# Patient Record
Sex: Female | Born: 1965 | Race: White | Hispanic: Yes | Marital: Married | Smoking: Never smoker
Health system: Southern US, Community
[De-identification: ages and names within clinical notes are randomized; demographics above are authoritative.]

---

## 1993-10-04 HISTORY — PX: CHOLECYSTECTOMY: SHX55

## 2009-01-29 ENCOUNTER — Ambulatory Visit: Payer: Self-pay | Admitting: Family Medicine

## 2009-01-29 DIAGNOSIS — O91219 Nonpurulent mastitis associated with pregnancy, unspecified trimester: Secondary | ICD-10-CM | POA: Insufficient documentation

## 2009-01-30 ENCOUNTER — Encounter: Payer: Self-pay | Admitting: Family Medicine

## 2009-02-27 ENCOUNTER — Encounter: Payer: Self-pay | Admitting: Family Medicine

## 2009-02-28 ENCOUNTER — Ambulatory Visit: Payer: Self-pay | Admitting: Physician Assistant

## 2009-03-19 ENCOUNTER — Ambulatory Visit: Payer: Self-pay | Admitting: Obstetrics & Gynecology

## 2009-03-19 ENCOUNTER — Encounter: Payer: Self-pay | Admitting: Obstetrics & Gynecology

## 2009-03-19 ENCOUNTER — Encounter: Admission: RE | Admit: 2009-03-19 | Discharge: 2009-03-19 | Payer: Self-pay | Admitting: Family Medicine

## 2009-03-19 ENCOUNTER — Ambulatory Visit: Payer: Self-pay | Admitting: Family Medicine

## 2009-03-19 DIAGNOSIS — M25569 Pain in unspecified knee: Secondary | ICD-10-CM

## 2009-03-19 DIAGNOSIS — R21 Rash and other nonspecific skin eruption: Secondary | ICD-10-CM

## 2009-03-20 ENCOUNTER — Encounter: Payer: Self-pay | Admitting: Family Medicine

## 2009-03-21 ENCOUNTER — Ambulatory Visit: Payer: Self-pay | Admitting: Family Medicine

## 2009-03-21 DIAGNOSIS — B353 Tinea pedis: Secondary | ICD-10-CM

## 2009-03-25 LAB — CONVERTED CEMR LAB
BUN: 13 mg/dL (ref 6–23)
Basophils Relative: 1 % (ref 0–1)
CO2: 23 meq/L (ref 19–32)
Calcium: 9 mg/dL (ref 8.4–10.5)
Chloride: 109 meq/L (ref 96–112)
Cholesterol: 149 mg/dL (ref 0–200)
Creatinine, Ser: 0.63 mg/dL (ref 0.40–1.20)
Eosinophils Absolute: 0.1 10*3/uL (ref 0.0–0.7)
HDL: 62 mg/dL (ref >39)
MCHC: 34.6 g/dL (ref 30.0–36.0)
MCV: 94.6 fL (ref 78.0–100.0)
Monocytes Absolute: 0.4 10*3/uL (ref 0.1–1.0)
Monocytes Relative: 6 % (ref 3–12)
Neutrophils Relative %: 56 % (ref 43–77)
RBC: 4.09 M/uL (ref 3.87–5.11)
Total CHOL/HDL Ratio: 2.4

## 2009-10-30 ENCOUNTER — Ambulatory Visit: Payer: Self-pay | Admitting: Family Medicine

## 2009-10-30 DIAGNOSIS — S0430XA Injury of trigeminal nerve, unspecified side, initial encounter: Secondary | ICD-10-CM

## 2010-03-16 ENCOUNTER — Ambulatory Visit: Payer: Self-pay | Admitting: Family Medicine

## 2010-03-16 DIAGNOSIS — M542 Cervicalgia: Secondary | ICD-10-CM | POA: Insufficient documentation

## 2010-03-16 DIAGNOSIS — R209 Unspecified disturbances of skin sensation: Secondary | ICD-10-CM | POA: Insufficient documentation
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
## 2010-11-03 NOTE — Assessment & Plan Note (Signed)
Summary: ARM PROBLEMS--SHOULDER   Vital Signs:  Patient profile:   45 year old female Height:      60 inches Weight:      131 pounds BMI:     25.68 O2 Sat:      99 % on Room air Pulse rate:   72 / minute BP sitting:   106 / 70  (left arm) Cuff size:   regular  Vitals Entered By: Payton Spark CMA (March 16, 2010 2:47 PM)  O2 Flow:  Room air CC: L neck and arm pain x 2 weeks. Also requests Rx for toenail fungus since she is no longer breast feeding.   Primary Care Provider:  Nani Gasser MD  CC:  L neck and arm pain x 2 weeks. Also requests Rx for toenail fungus since she is no longer breast feeding.Marland Kitchen  History of Present Illness: 45 yo woman here today for R neck and L arm pain.  L arm goes numb when pt is asleep.  reports sleeping on her back.  no current numbness but reports sxs are more frequent.  has been using ice on her neck w/out relief.  ibuprofen provides temporary relief.  some weakness of L grip when arm is numb.  currently no weakness- 'es normal'.  fungal infxn- pt requesting oral medication now that she is no longer breast feeding.  this was discussed at pt's last OV.  **almost impossible to obtain a hx from pt b/c she does not speak English and is having her oldest child translate for her.  has 3 other children in the room running around and screaming.  she is more preoccupied with them than with the appt and answering questions**  Problems Prior to Update: 1)  Arm Numbness  (ICD-782.0) 2)  Neck Pain, Right  (ICD-723.1) 3)  Injury To Trigeminal Nerve  (ICD-951.2) 4)  Tinea Pedis  (ICD-110.4) 5)  Skin Rash  (ICD-782.1) 6)  Knee Pain, Bilateral  (ICD-719.46) 7)  Health Maintenance Exam  (ICD-V70.0) 8)  Mastitis, Lactating  (ICD-675.20)  Current Medications (verified): 1)  Systane Eye Drops .... As Needed 2)  Errin 0.35 Mg Tabs (Norethindrone (Contraceptive)) .Marland Kitchen.. 1 By Mouth Daily  Allergies (verified): No Known Drug Allergies  Review of Systems  See HPI  Physical Exam  General:  Well-developed,well-nourished,in no acute distress; alert,appropriate and cooperative throughout examination Head:  Normocephalic and atraumatic without obvious abnormalities. No apparent alopecia or balding. Neck:  + Spurling's bilaterally + TTP along R SCM muscle, no pain w/ flexion and extension of neck but mild pain w/ rotation Msk:  full ROM of shoulders bilaterally Extremities:  No clubbing, cyanosis, edema, or deformity noted with normal full range of motion of all joints.   Neurologic:  cranial nerves II-XII intact, strength normal in all extremities, sensation intact to light touch, and DTRs symmetrical and normal.   Skin:  + tinea pedis   Impression & Recommendations:  Problem # 1:  NECK PAIN, RIGHT (ICD-723.1) Assessment New give pt's tenderness along her SCM is most likely muscular in nature.  start NSAID and muscle relaxer.  get Cspine films to r/o misalignment/subluxation.  ? arthritic component given + Spurling's, will need f/u. Her updated medication list for this problem includes:    Naprosyn 500 Mg Tabs (Naproxen) .Marland Kitchen... 1 two times a day x10 days and then as needed.  take w/ food.  please label in spanish.    Cyclobenzaprine Hcl 10 Mg Tabs (Cyclobenzaprine hcl) .Marland Kitchen... 1 by mouth at bedtime as  needed for muscle spasm.  please label in spanish  Orders: T-Cervical Spine Comp 4 Views [phone])  Problem # 2:  ARM NUMBNESS (ICD-782.0) Assessment: New no current signs, pt reports this only happens when she is asleep.  likely positional.  if sxs persist after course of NSAIDs and muscle relaxers may need additional w/u.  as noted, very difficult to get information re: this problem from pt due to chaotic environment in exam room.  Problem # 3:  TINEA PEDIS (ICD-110.4) Assessment: Unchanged as discussed at previous OV will start pt on oral meds.  LFTs from recent CPE were WNL. The following medications were removed from the medication list:     Econazole Nitrate 1 % Crea (Econazole nitrate) .Marland Kitchen... Apply once daily after wash feet for 3 weeks.  try to keep feet dry if possible. Her updated medication list for this problem includes:    Lamisil 250 Mg Tabs (Terbinafine hcl) .Marland Kitchen... 1 tab by mouth daily x12 weeks.  please label in spanish  Complete Medication List: 1)  Systane Eye Drops  .... As needed 2)  Errin 0.35 Mg Tabs (Norethindrone (contraceptive)) .Marland Kitchen.. 1 by mouth daily 3)  Naprosyn 500 Mg Tabs (Naproxen) .Marland Kitchen.. 1 two times a day x10 days and then as needed.  take w/ food.  please label in spanish. 4)  Cyclobenzaprine Hcl 10 Mg Tabs (Cyclobenzaprine hcl) .Marland Kitchen.. 1 by mouth at bedtime as needed for muscle spasm.  please label in spanish 5)  Lamisil 250 Mg Tabs (Terbinafine hcl) .Marland Kitchen.. 1 tab by mouth daily x12 weeks.  please label in spanish  Patient Instructions: 1)  Please follow up in 2 weeks if no improvement in pain or numbness 2)  Take the Naprosyn as directed for inflammation 3)  Use the Cyclobenzaprine nightly as needed for muscle spasm 4)  Use a heating pad to help relax the muscles 5)  Take the Lamisil daily for 12 weeks for the foot fungus 6)  If your symptoms worsen please go to the ER 7)  Hang in there! Prescriptions: LAMISIL 250 MG TABS (TERBINAFINE HCL) 1 tab by mouth daily x12 weeks.  please label in spanish  #84 x 0   Entered and Authorized by:   Neena Rhymes MD   Signed by:   Neena Rhymes MD on 03/16/2010   Method used:   Electronically to        [facility] (retail)       [address]       [place], [place]  [address]       Ph: [phone]       Fax: [phone]   RxID:   [id number] CYCLOBENZAPRINE HCL 10 MG  TABS (CYCLOBENZAPRINE HCL) 1 by mouth at bedtime as needed for muscle spasm.  please label in spanish  #20 x 0   Entered and Authorized by:   Neena Rhymes MD   Signed by:   Neena Rhymes MD on 03/16/2010   Method used:   Electronically to        [facility]  (retail)       [address]       [place], [place]  [address]       Ph: [phone]       Fax: [phone]   RxID:   [id number] NAPROSYN 500 MG TABS (NAPROXEN) 1 two times a day x10 days and then as needed.  take w/ food.  please label in spanish.  #60 x 0  Entered and Authorized by:   Neena Rhymes MD   Signed by:   Neena Rhymes MD on 03/16/2010   Method used:   Electronically to        [facility] (retail)       [address]       [place], [place]  [address]       Ph: [phone]       Fax: [phone]   RxID:   [id number]

## 2010-11-06 NOTE — Letter (Signed)
Summary: Medical Report Form/[facility]  Medical Report Form/[facility]   Imported By: Lanelle Bal 11/06/2009 10:14:32  _____________________________________________________________________  External Attachment:    Type:   Image     Comment:   External Document

## 2011-01-26 ENCOUNTER — Encounter: Payer: Self-pay | Admitting: Family Medicine

## 2011-01-26 ENCOUNTER — Inpatient Hospital Stay (INDEPENDENT_AMBULATORY_CARE_PROVIDER_SITE_OTHER)
Admission: RE | Admit: 2011-01-26 | Discharge: 2011-01-26 | Disposition: A | Discharge status: Home / Self Care | Payer: 59 | Source: Ambulatory Visit | Attending: Family Medicine | Admitting: Family Medicine

## 2011-01-26 DIAGNOSIS — M7710 Lateral epicondylitis, unspecified elbow: Secondary | ICD-10-CM

## 2011-01-26 DIAGNOSIS — J309 Allergic rhinitis, unspecified: Secondary | ICD-10-CM | POA: Insufficient documentation

## 2011-01-26 DIAGNOSIS — R519 Headache, unspecified: Secondary | ICD-10-CM | POA: Insufficient documentation

## 2011-01-26 DIAGNOSIS — D509 Iron deficiency anemia, unspecified: Secondary | ICD-10-CM | POA: Insufficient documentation

## 2011-01-26 DIAGNOSIS — M7520 Bicipital tendinitis, unspecified shoulder: Secondary | ICD-10-CM

## 2011-01-26 DIAGNOSIS — N920 Excessive and frequent menstruation with regular cycle: Secondary | ICD-10-CM | POA: Insufficient documentation

## 2011-01-26 DIAGNOSIS — R51 Headache: Secondary | ICD-10-CM | POA: Insufficient documentation

## 2011-02-13 ENCOUNTER — Other Ambulatory Visit: Payer: Self-pay | Admitting: Family Medicine

## 2011-02-15 NOTE — Telephone Encounter (Signed)
Pt was seen while covering for Dr Linford Arnold at [facility].  No meds from this office.

## 2011-02-15 NOTE — Telephone Encounter (Signed)
Noted, will FWD to appropriate MD.

## 2011-02-15 NOTE — Telephone Encounter (Signed)
Last refilled 03/16/10 and appears that pt is due for CPX. Please advise.

## 2011-02-16 NOTE — Assessment & Plan Note (Signed)
Lisa Krueger, Lisa Krueger              ACCOUNT NO.:  [id number]   MEDICAL RECORD NO.:  [id number]          PATIENT TYPE:  POB   LOCATION:  [facility] [place]         FACILITY:  [facility]   PHYSICIAN:  Allie Bossier, MD        DATE OF BIRTH:  [birth year]   DATE OF SERVICE:  03/18/2009                                  CLINIC NOTE   HISTORY:  Lisa Krueger is a 45 year old married Hispanic gravida 7, para  6, abortus 1, who now has a 69-month-old child that she is breast-  feeding.  She comes in here for an annual exam.  She wishes to have a  tubal ligation scheduled.   PAST MEDICAL HISTORY:  None.   PAST SURGICAL HISTORY:  Laparoscopic cholecystectomy.   REVIEW OF SYSTEMS:  She is married for 17 years.  She has been abstinent  for the last 4 months due to her fear of getting pregnant.  She is  breast-feeding.  Pap smear was done in 2009 and was normal.   MEDICATIONS:  1. Errin oral contraceptive pills.  2. Multivitamins daily.  3. She use hydrocortisone cream to a fungal infection on her toes.   ALLERGIES:  NO KNOWN DRUG ALLERGIES.  No latex allergies.   FAMILY HISTORY:  Negative for breast and colon cancer, but her mother  had uterine cancer.   PHYSICAL EXAMINATION:  VITAL SIGNS:  Weight 117, height 5 feet, blood  pressure 109/72, pulse 74.  HEENT:  Normal.  HEART:  Regular rate and rhythm.  LUNGS:  Clear to auscultation bilaterally.  BREASTS:  Normal.  ABDOMEN:  Benign.  EXTERNAL GENITALIA:  No lesions.  Cervix parous, no lesions.  Normal  discharge.  Uterus normal size and shape, retroverted, nontender.  Adnexa nontender.  No masses.   ASSESSMENT/PLAN:  1. Annual exam.  Check the Pap smear.  Recommend self-breast exams.      Please note that she was treated recently with doxycycline for      mastitis that has cleared up now.  2. Multiparity, desires sterility in the form of a tubal ligation.  I      will submit the paperwork and it will be scheduled at her       convenience.      Allie Bossier, MD     MCD/MEDQ  D:  03/19/2009  T:  03/19/2009  Job:  [id number]

## 2011-09-06 NOTE — Progress Notes (Signed)
Summary: dizzyness/left hand fills numb/bleeding for 2 weeks( room 2)   Vital Signs:  Patient Profile:   45 Years Old Female CC:      left arm pain from shoulder to hand; worsened past 2 days Height:     60 inches Weight:      136.50 pounds O2 treatment:    Room Air Temp:     98.2 degrees F oral Pulse rate:   73 / minute Resp:     16 per minute BP sitting:   107 / 73  (left arm) Cuff size:   regular  Pt. in pain?   yes    Location:    left shoulder/arm    Type:       heaviness  Vitals Entered By: Lavell Islam RN (January 26, 2011 10:09 AM)                   Current Allergies: No known allergies History of Present Illness Chief Complaint: left arm pain from shoulder to hand; worsened past 2 days History of Present Illness:  Subjective:  Patient complains of vague pain in her left arm and shoulder for about 2 days.  No chest pain.  No trauma to the arm. She also complains of persistent vaginal bleeding without abdominal pain.  She recently had a negative pregnancy test, and has an appt with gyn Dr. Ivor Costa on 02/02/11 at 2:20 PM for evaluation.  No nausea/vomiting.  No fever.  REVIEW OF SYSTEMS Constitutional Symptoms       Complains of fatigue.     Denies fever, chills, night sweats, weight loss, and weight gain.      Comments: not sleeping past 2 nights Eyes       Complains of glasses.      Denies change in vision, eye pain, eye discharge, contact lenses, and eye surgery.      Comments: itchy eyes/ allergies Ear/Nose/Throat/Mouth       Complains of frequent runny nose.      Denies hearing loss/aids, change in hearing, ear pain, ear discharge, dizziness, frequent nose bleeds, sinus problems, sore throat, hoarseness, and tooth pain or bleeding.  Respiratory       Denies dry cough, productive cough, wheezing, shortness of breath, asthma, bronchitis, and emphysema/COPD.  Cardiovascular       Denies murmurs, chest pain, and tires easily with exhertion.    Gastrointestinal       Denies stomach pain, nausea/vomiting, diarrhea, constipation, blood in bowel movements, and indigestion. Genitourniary       Complains of blood or discharge from vagina.      Denies painful urination, kidney stones, and loss of urinary control.      Comments: vaginal bleeding x 2 weeks Neurological       Complains of headaches, numbness, and weakness.      Denies loss of or changes in sensation, tngling, tremors, paralysis, seizures, and fainting/blackouts.      Comments: left arm Musculoskeletal       Complains of muscle pain, joint pain, and muscle weakness.      Denies joint stiffness, decreased range of motion, redness, swelling, and gout.      Comments: left arm; shoulder to hand Skin       Denies bruising, unusual mles/lumps or sores, and hair/skin or nail changes.  Psych       Denies mood changes, temper/anger issues, anxiety/stress, speech problems, depression, and sleep problems. Other Comments: primary concern left arm discomfort for past 2 days  not permitting sleep; in process of GYN evaluation; had leg pain evaluated at [facility] about a month ago>xrays and encouraged to take Vitamins, with Vit D   Past History:  Family History: Last updated: 03/19/2009 mother - diabetes father - high blood opressure, prostate Cancer brother - high cholesterol  Social History: Last updated: 03/19/2009 denies smoking, drinking or recreational drug use. Lives at home with 4 kiids.  She has 5 children. 2 daughters and 3 sons.    Past Medical History: Gyn - [facility]: W/S appt. 02/02/11 Dr. Linford Arnold  Allergic rhinitis Vitamin D deficiency Headache Dysfunctional uterine bleeding  Past Surgical History: Reviewed history from 01/29/2009 and no changes required. Denies surgical history  Family History: Reviewed history from 03/19/2009 and no changes required. mother - diabetes father - high blood opressure, prostate Cancer brother - high cholesterol  Social  History: Reviewed history from 03/19/2009 and no changes required. denies smoking, drinking or recreational drug use. Lives at home with 4 kiids.  She has 5 children. 2 daughters and 3 sons.     Objective:  Appearance:  Patient appears healthy, stated age, and in no acute distress  Eyes:  Pupils are equal, round, and reactive to light and accomdation.  Extraocular movement is intact.  Conjunctivae are not inflamed.  Neck:  Supple.  No adenopathy is present.   Left shoulder:  distinct tenderness over the insertion of biceps tendon.  Shoulder has full range of motion  Left arm:  Tenderness over the lateral epicondyle Lungs:  Clear to auscultation.  Breath sounds are equal.  Heart:  Regular rate and rhythm without murmurs, rubs, or gallops.  Abdomen:  Nontender without masses or hepatosplenomegaly.  Bowel sounds are present.  No CVA or flank tenderness.  CBC:  WBC 7.8; 40.0 LY, 2.5 MO, 57.5 GR; Hgb 14.1 EKG:  normal Assessment New Problems: MENORRHAGIA (ICD-626.2) BICEPS TENDINITIS, LEFT (ICD-726.12) LATERAL EPICONDYLITIS, LEFT (ICD-726.32) HEADACHE (ICD-784.0) ANEMIA-IRON DEFICIENCY (ICD-280.9) ALLERGIC RHINITIS (ICD-477.9)  PATIENT WILL NEED GYN EVALUATION FOR VAGINAL BLEEDING.  NO ANEMIA PRESENT LEFT ARM PAIN A RESULT OF BICEPS TENDONITIS AND LATERAL EPICONDYLITIS  Plan New Medications/Changes: MELOXICAM 15 MG TABS (MELOXICAM) One by mouth daily with food  #15 x 0, 01/26/2011, Donna Christen MD  New Orders: Est. Patient Level IV [04540] CBC w/Diff [98119-14782] EKG w/ Interpretation [93000] Planning Comments:   Begin Mobic.  Begin applying ice pack to left shoulder and elbow.  Begin stretching and range of motion exercises ([employer] information and instruction patient handout given)  Follow-up with Dr.Miyazaki   The patient and/or caregiver has been counseled thoroughly with regard to medications prescribed including dosage, schedule, interactions, rationale for use, and  possible side effects and they verbalize understanding.  Diagnoses and expected course of recovery discussed and will return if not improved as expected or if the condition worsens. Patient and/or caregiver verbalized understanding.  Prescriptions: MELOXICAM 15 MG TABS (MELOXICAM) One by mouth daily with food  #15 x 0   Entered and Authorized by:   Donna Christen MD   Signed by:   Donna Christen MD on 01/26/2011   Method used:   Print then Give to Patient   RxID:   [id number]   Orders Added: 1)  Est. Patient Level IV [[address]] 2)  CBC w/Diff [41324-40102] 3)  EKG w/ Interpretation [93000]    Laboratory Results   Blood Tests   Date/Time Received: January 26, 2011 10:36 AM  Date/Time Reported: January 26, 2011 10:36 AM

## 2012-05-24 ENCOUNTER — Other Ambulatory Visit (HOSPITAL_COMMUNITY): Payer: Self-pay | Admitting: Geriatric Medicine

## 2012-05-24 DIAGNOSIS — Z1231 Encounter for screening mammogram for malignant neoplasm of breast: Secondary | ICD-10-CM

## 2012-06-08 ENCOUNTER — Ambulatory Visit (HOSPITAL_COMMUNITY)
Admission: RE | Admit: 2012-06-08 | Discharge: 2012-06-08 | Disposition: A | Discharge status: Home / Self Care | Payer: Self-pay | Source: Ambulatory Visit | Attending: Geriatric Medicine | Admitting: Geriatric Medicine

## 2012-06-08 DIAGNOSIS — Z1231 Encounter for screening mammogram for malignant neoplasm of breast: Secondary | ICD-10-CM

## 2013-12-18 ENCOUNTER — Ambulatory Visit: Payer: 59 | Admitting: Obstetrics & Gynecology

## 2014-01-09 ENCOUNTER — Ambulatory Visit: Payer: 59 | Admitting: Family Medicine

## 2014-01-09 DIAGNOSIS — Z124 Encounter for screening for malignant neoplasm of cervix: Secondary | ICD-10-CM

## 2014-02-27 ENCOUNTER — Ambulatory Visit (INDEPENDENT_AMBULATORY_CARE_PROVIDER_SITE_OTHER): Payer: BC Managed Care – PPO | Admitting: Obstetrics & Gynecology

## 2014-02-27 ENCOUNTER — Encounter: Payer: Self-pay | Admitting: Obstetrics & Gynecology

## 2014-02-27 VITALS — BP 124/80 | HR 76 | Resp 16 | Ht 61.0 in | Wt 132.0 lb

## 2014-02-27 DIAGNOSIS — Z3009 Encounter for other general counseling and advice on contraception: Secondary | ICD-10-CM

## 2014-02-27 DIAGNOSIS — Z1151 Encounter for screening for human papillomavirus (HPV): Secondary | ICD-10-CM

## 2014-02-27 DIAGNOSIS — R102 Pelvic and perineal pain: Secondary | ICD-10-CM

## 2014-02-27 DIAGNOSIS — N949 Unspecified condition associated with female genital organs and menstrual cycle: Secondary | ICD-10-CM

## 2014-02-27 DIAGNOSIS — Z124 Encounter for screening for malignant neoplasm of cervix: Secondary | ICD-10-CM

## 2014-02-27 DIAGNOSIS — Z01419 Encounter for gynecological examination (general) (routine) without abnormal findings: Secondary | ICD-10-CM

## 2014-02-27 DIAGNOSIS — Z0000 Encounter for general adult medical examination without abnormal findings: Secondary | ICD-10-CM

## 2014-02-27 NOTE — Patient Instructions (Signed)
Levonorgestrel intrauterine device (IUD) Qu es este medicamento? El LEVONORGESTREL (DIU) es un dispositivo anticonceptivo (control de natalidad). El dispositivo se coloca dentro del tero por un profesional de la salud. Se utiliza para Therapist, occupational y tambin se puede Risk manager para tratar el sangrado abundante que ocurre durante su perodo. Dependiendo del dispositivo, se puede utilizar por 3 a 5 aos. Este medicamento puede ser utilizado para otros usos; si tiene alguna pregunta consulte con su proveedor de atencin mdica o con su farmacutico. MARCAS COMERCIALES DISPONIBLES: Kennieth Rad le debo informar a mi profesional de la salud antes de tomar este medicamento? Necesita saber si usted presenta alguno de los siguientes problemas o situaciones: -exmen de Papanicolaou anormal -cncer de mama, cuello del tero o tero -diabetes -endometritis -si tiene una infeccin plvica o genital actual o en el pasado -tiene ms de una pareja sexual o si su pareja tiene ms de una pareja -enfermedad cardiaca -antecedente de embarazo tubrico o ectpico -problemas del sistema inmunolgico -DIU colocado -enfermedad heptica o tumor del hgado -problemas con la coagulacin o si toma diluyentes sanguneos -[place] medicamentos intravenoso -forma inusual del tero -sangrado vaginal que no tiene explicacin -una reaccin alrgica o inusual al levonorgestrel, a otras hormonas, a la silicona o polietilenos, a otros medicamentos, alimentos, colorantes o conservantes -si est embarazada o buscando quedar embarazada -si est amamantando a un beb Cmo debo utilizar este medicamento? Un profesional de Estate agent este dispositivo en el tero. Hable con su pediatra para informarse acerca del uso de este medicamento en nios. Puede requerir atencin especial. Sobredosis: Pngase en contacto inmediatamente con un centro toxicolgico o una sala de urgencia si usted cree que haya tomado demasiado  medicamento. ATENCIN: [employer] es solo para usted. No comparta este medicamento con nadie. Qu sucede si me olvido de una dosis? No se aplica en este caso. Qu puede interactuar con este medicamento? No tome esta medicina con ninguno de los siguientes medicamentos: -amprenavir -bosentano -fosamprenavir Esta medicina tambin puede interactuar con los siguientes medicamentos: -aprepitant -barbitricos para producir el sueo o para el tratamiento de convulsiones -bexaroteno -griseofulvina -medicamentos para tratar los convulsiones, tales como [place], [place], [place], [place], [place], topiramato -modafinilo -pioglitazona -rifabutina -rifampicina -rifapentina -algunos medicamentos para tratar el virus VIH, tales como atazanavir, indinavir, lopinavir, nelfinavir, tipranavir, ritonavir -hierba de [place] -warfarina Puede ser que esta lista no menciona todas las posibles interacciones. Informe a su profesional de [employer]	[place] de [employer] productos a base de hierbas, medicamentos de [place] o suplementos nutritivos que est tomando. Si usted fuma, consume bebidas alcohlicas o si utiliza drogas ilegales, indqueselo tambin a su profesional de [employer]	[place]. Algunas sustancias pueden interactuar con su medicamento. A qu debo estar atento al usar [employer]? Visite a su mdico o a su profesional de la salud para chequear su evolucin peridicamente. Visite a su mdico si usted o su pareja tiene relaciones sexuales con [employer], se vuelve VIH positivo o contrae una enfermedad de transmisin sexual. Este medicamento no la protege de la infeccin por VIH (SIDA) ni de ninguna otra enfermedad de transmisin sexual. Puede controlar la ubicacin del DIU usted misma palpando con sus dedos limpios los hilos en la parte anterior de la vagina. No tire de los hilos. Es un buen hbito controlar la ubicacin del dispositivo despus de cada perodo menstrual. Si no slo  siente los hilos sino que adems siente otra parte ms del DIU o si no puede sentir los hilos, consulte a su mdico  inmediatamente. El DIU puede salirse por s solo. Puede quedar embarazada si el dispositivo se sale de Chief of Staff. Utilice un mtodo anticonceptivo adicional, como preservativos, y consulte a su proveedor de atencin mdica s observa que el DIU se sali de Chief of Staff. La utilizacin de tampones no cambia la posicin del DIU y no hay inconvenientes en usarlos durante su perodo. Qu efectos secundarios puedo tener al [employer] este medicamento? Efectos secundarios que debe informar a su mdico o a Barrister's clerk de la salud tan pronto como sea posible: -Chief of Staff como erupcin cutnea, picazn o urticarias, hinchazn de la cara, labios o lengua -fiebre, sntomas gripales -llagas genitales -alta presin sangunea -ausencia de un perodo menstrual durante 6 semanas mientras lo utiliza -Social research officer, government, Occupational hygienist en las piernas -dolor o sensibilidad del plvico -dolor de cabeza repentino o severo -signos de Media planner -calambres estomacales -falta de aliento repentina -problemas de coordinacin, del habla, al caminar -sangrado, flujo vaginal inusual -color amarillento de los ojos o la piel Efectos secundarios que, por lo general, no requieren atencin mdica (debe informarlos a su mdico o a su profesional de la salud si persisten o si son molestos): -acn -dolor de pecho -cambios en el deseo sexual o capacidad -cambios de peso -calambres, Tree surgeon o sensacin de [employer] se introduce el dispositivo -dolor de cabeza -sangrado menstruales irregulares en los primeros 3 a 6 meses de usar -nuseas Puede ser que esta lista no menciona todos los posibles efectos secundarios. Comunquese a su mdico por asesoramiento mdico [employer]. Usted puede informar los efectos secundarios a la FDA por telfono al 1-800-FDA-1088. Dnde debo guardar mi medicina? No  se aplica en este caso. ATENCIN: Este folleto es un resumen. Puede ser que no cubra toda la posible informacin. Si usted tiene preguntas acerca de esta medicina, consulte con su mdico, su farmacutico o su profesional de Technical sales engineer.  2014, Elsevier/[employer] Standard. (2011-11-09 16:57:41) Eleccin del mtodo anticonceptivo (Contraception Choices) La anticoncepcin (control de la natalidad) es el uso de cualquier mtodo o dispositivo para Therapist, occupational. A continuacin se indican algunos de esos mtodos. [place] contraconceptivo consiste en un tubo plstico delgado que contiene la hormona progesterona. No contiene estrgenos. El mdico inserta el tubo en la parte interna del brazo. El tubo puede Nutritional therapist durante 3 aos. Despus de los 3 aos debe retirarse. El implante impide que los ovarios liberen vulos (ovulacin), espesa el moco cervical, lo que evita que los espermatozoides ingresen al tero y hace ms delgada la membrana que cubre el interior del tero.  Inyecciones de progesterona sola: las Water engineer cada 3 meses para Therapist, occupational. La progesterona sinttica impide que los ovarios liberen vulos. Tambin hacen que el moco cervical se espese y modifique el tejido de recubrimiento interno del tero. Esto hace ms difcil que los espermatozoides sobrevivan en el tero.  Las pldoras anticonceptivas contienen estrgenos y Immunologist. Su funcin es [employer] ovarios liberen vulos (ovulacin). Las hormonas de los anticonceptivos orales hacen que el moco cervical se haga ms espeso, lo que evita que el esperma ingrese al tero. Las pldoras anticonceptivas son recetadas por el mdico.Tambin se utilizan para tratar los perodos menstruales abundantes.  Minipldora: este tipo de pldora anticonceptiva contiene slo hormona progesterona. Deben tomarse todos los das del mes y debe recetarlas el mdico.  El parche de control de natalidad:  contiene hormonas similares a las que contienen las pldoras anticonceptivas. Deben Sherrill Raring  vez por semana y se utilizan bajo prescripcin mdica.  Anillo vaginal: contiene hormonas similares a las que contienen las pldoras anticonceptivas. Se deja colocado durante tres semanas, se lo retira durante 1 semana y luego se coloca uno nuevo. La paciente debe sentirse cmoda al insertar y retirar el anillo de la vagina.Es necesaria la prescripcin mdica.  Anticonceptivos de emergencia: son mtodos para evitar un embarazo despus de Ardelia Mems relacin sexual sin proteccin. Esta pldora puede tomarse inmediatamente despus de Office manager sexuales o hasta 5 [place] de haber tenido sexo sin proteccin. Es ms efectiva si se toma poco tiempo despus de la relacin sexual. Los anticonceptivos de emergencia estn disponibles sin prescripcin mdica. Consltelo con su farmacutico. No use los anticonceptivos de emergencia como nico mtodo anticonceptivo. MTODOS DE [place]us   Condn masculino: es una vaina delgada (ltex o goma) que se coloca cubriendo al pene durante el acto sexual. Grier Rocher con espermicida para aumentar la efectividad.  Condn femenino. Es una funda delicada y blanda que se adapta holgadamente a la vagina antes de las Armed forces operational officer.  Diafragma: es una barrera de ltex redonda y suave que debe ser recomendado por un profesional. Se inserta en la vagina, junto con un gel espermicida. Debe insertarse antes de Clinical biochemist. Debe dejar el diafragma colocado en la vagina durante 6 a 8 horas despus de la relacin sexual.  Capuchn cervical: es una barrera de ltex o taza plstica redonda y [place] que cubre el cuello del tero y debe ser colocada por un mdico. Puede dejarlo colocado en la vagina hasta 48 horas despus [place].  Esponja: es una pieza blanda y circular de espuma de poliuretano. Contiene un espermicida. Se inserta en la vagina despus de  mojarla y antes de las [employer].  Espermicidas: son sustancias qumicas que matan o bloquean al esperma y no lo dejan ingresar al cuello del tero y al tero. Vienen en forma de cremas, geles, supositorios, espuma o comprimidos. No es necesario tener Furniture conservator/restorer. Se insertan en la vagina con un aplicador antes de Clinical biochemist. El proceso debe repetirse cada vez que tiene relaciones sexuales. ANTICONCEPTIVOS INTRAUTERINOS  Dispositivo intrauterino (DIU) es un dispositivo en forma de T que se coloca en el tero durante el perodo menstrual, para Therapist, occupational. Hay dos tipos:  DIU de cobre: este tipo de DIU est recubierto con un alambre de cobre y se inserta dentro del tero. El cobre hace que el tero y las trompas de Falopio produzcan un liquido que [employer] espermatozoides. Puede permanecer colocado durante 10 aos.  DIU con hormona: este tipo de DIU contiene la hormona progestina (progesterona sinttica). La hormona espesa el moco cervical y evita que los espermatozoides ingresen al tero y tambin afina la membrana que cubre el tero para evitar la implantacin del vulo fertilizado. La hormona debilita o destruye los espermatozoides que ingresan al tero. Puede Nutritional therapist durante 3 5 aos, segn el tipo de DIU que se utilice. MTODOS ANTICONCEPTIVOS PERMANENTES  Ligadura de trompas en la mujer: se realiza sellando, atando u obstruyendo quirrgicamente las trompas de Falopio lo que impide que el vulo descienda hacia el tero.  Esterilizacin histeroscpica: Implica la colocacin de un pequeo espiral o la insercin en cada trompa de Falopio. El mdico utiliza una tcnica llamada histeroscopa para Teacher, music procedimiento. El dispositivo produce la formacin de tejido Pensions consultant. Midwife como resultado una obstruccin permanente de las trompas de [place], de modo que la  esperma no pueda fertilizar el vulo. Demora alrededor de 3 meses despus del  procedimiento hasta que el conducto se obstruye. Tendr que usar otro mtodo anticonceptivo durante al menos 3 meses.  Esterilizacin masculina: se realiza ligando los conductos por los que pasan los espermatozoides (vasectoma).Esto impide que el esperma ingrese a la vagina durante el acto sexual. Luego del procedimiento, el hombre puede eyacular lquido (semen). MTODOS DE PLANIFICACIN NATURAL  Planificacin familiar natural: consiste en no Clinical biochemist o usar un mtodo de barrera (condn, [place], capuchn cervical) en los [employer] la mujer podra quedar [place].  Mtodo de calendario: consiste en el seguimiento de la duracin de cada ciclo menstrual y la identificacin de los perodos frtiles.  Mtodo de ovulacin: Dance movement psychotherapist las relaciones sexuales durante la ovulacin.  Mtodo sintotrmico: Energy manager sexuales en la poca en la que se est ovulando, utilizando un termmetro y tendiendo en cuenta los sntomas de la ovulacin.  Mtodo postovulacin: Museum/gallery conservator las relaciones sexuales para despus de haber ovulado. Independientemente del tipo o mtodo anticonceptivo que usted elija, es importante que use condones para protegerse contra las infecciones de transmisin sexual (ETS). Hable con su mdico con respecto a qu mtodo anticonceptivo es el ms apropiado para usted. Document Released: 09/20/2005 Document Revised: 05/23/2013 [facility] Patient Information 2014 [place], [place].

## 2014-02-27 NOTE — [place] Notes (Signed)
  Subjective:     Lisa Krueger is a 48 y.o. female here for a routine exam.  Current complaints: desires a change in contraception from condoms (considering Mirena).  Pt c/o vaginal mass at introitus.  Personal health questionnaire reviewed: yes.   Gynecologic History Patient's last menstrual period was 02/14/2014. Contraception: condoms Last Pap: 2010. Results were: normal Last mammogram: 2013. Results were: normal  Obstetric History OB History  Gravida Para Term Preterm AB SAB TAB Ectopic Multiple Living  7 5   2 1  1  5     # Outcome Date GA Lbr Len/2nd Weight Sex Delivery Anes PTL Lv  7 PAR           6 PAR           5 PAR           4 PAR           3 PAR           2 ECT           1 SAB                The following portions of the patient's history were reviewed and updated as appropriate: allergies, current medications, past family history, past medical history, past social history, past surgical history and problem list.  Review of Systems Pertinent items are noted in HPI.    Objective:      Filed Vitals:   02/27/14 0959  BP: 124/80  Pulse: 76  Resp: 16  Height: 5\' 1"  (1.549 m)  Weight: 132 lb (59.875 kg)   Vitals:  WNL General appearance: alert, cooperative and no distress Head: Normocephalic, without obvious abnormality, atraumatic Eyes: negative Throat: lips, mucosa, and tongue normal; teeth and gums normal Lungs: clear to auscultation bilaterally Breasts: normal appearance, no masses or tenderness, No nipple retraction or dimpling, No nipple discharge or bleeding Heart: regular rate and rhythm Abdomen: soft, non-tender; bowel sounds normal; no masses,  no organomegaly  Pelvic:  External Genitalia:  Tanner V, no lesion Urethra:  No prolapse Vagina:  Pink, normal rugae, no blood or discharge Cervix:  No CMT, no lesion Uterus:  Enlarge uterus (probable fibroids, but could be ovarian origin). Adnexa:  Normal, no masses, non tender Rectovaginal  Septum:  Non tender, no masses  Pt given a mirror to point out he vaginal mass--she pointed to her hymenal remnant.  Explained this was nml.  Extremities: no edema, redness or tenderness in the calves or thighs Skin: no lesions or rash Lymph nodes: Axillary adenopathy: none         Assessment:    Healthy female exam.  Enlarged uterus, mildly tender   Plan:    Education reviewed: self breast exams. Contraception: condoms. Mammogram ordered. Follow up in: 3 weeks. Pap with cotesting Pelvic US for enlarged uterus / left ovarian mass and pain. Pt considering Mirena.

## 2014-02-28 NOTE — Addendum Note (Signed)
Addended by: Asencion Islam on: 02/28/2014 10:42 AM   Modules accepted: Orders

## 2014-03-06 ENCOUNTER — Ambulatory Visit (HOSPITAL_COMMUNITY): Payer: BC Managed Care – PPO

## 2014-03-08 ENCOUNTER — Other Ambulatory Visit: Payer: Self-pay | Admitting: Obstetrics & Gynecology

## 2014-03-08 ENCOUNTER — Ambulatory Visit (HOSPITAL_COMMUNITY)
Admission: RE | Admit: 2014-03-08 | Discharge: 2014-03-08 | Disposition: A | Discharge status: Home / Self Care | Payer: BC Managed Care – PPO | Source: Ambulatory Visit | Attending: Obstetrics & Gynecology | Admitting: Obstetrics & Gynecology

## 2014-03-08 DIAGNOSIS — Z1231 Encounter for screening mammogram for malignant neoplasm of breast: Secondary | ICD-10-CM

## 2014-03-08 DIAGNOSIS — D251 Intramural leiomyoma of uterus: Secondary | ICD-10-CM | POA: Insufficient documentation

## 2014-03-08 DIAGNOSIS — R102 Pelvic and perineal pain: Secondary | ICD-10-CM

## 2014-03-08 DIAGNOSIS — N83209 Unspecified ovarian cyst, unspecified side: Secondary | ICD-10-CM | POA: Insufficient documentation

## 2014-03-08 DIAGNOSIS — N852 Hypertrophy of uterus: Secondary | ICD-10-CM | POA: Insufficient documentation

## 2014-03-08 DIAGNOSIS — Z0000 Encounter for general adult medical examination without abnormal findings: Secondary | ICD-10-CM

## 2014-03-08 DIAGNOSIS — N949 Unspecified condition associated with female genital organs and menstrual cycle: Secondary | ICD-10-CM | POA: Insufficient documentation

## 2014-03-11 ENCOUNTER — Telehealth: Payer: Self-pay | Admitting: *Deleted

## 2014-03-11 ENCOUNTER — Encounter: Payer: Self-pay | Admitting: *Deleted

## 2014-03-11 NOTE — Telephone Encounter (Signed)
Message copied by Asencion Islam on Mon Mar 11, 2014  1:40 PM ------      Message from: Guss Bunde      Created: Sat Mar 09, 2014  5:54 PM       Benign 3.2 cm cyst which was felt on exam.  NO follow up needed per      Management of Asymptomatic Ovarian and Other Adnexal Cysts Imaged at [place]: [employer] [facility] in [place]       ------

## 2014-03-11 NOTE — Telephone Encounter (Signed)
Letter sent to home address stating that the ovarian cyst felt on exam was benign and no further follow up is needed per Dr Gala Romney.

## 2014-08-05 ENCOUNTER — Encounter: Payer: Self-pay | Admitting: Obstetrics & Gynecology

## 2014-12-27 IMAGING — US US TRANSVAGINAL NON-OB
1 series · 14 of 25 positions shown · non-contrast
Comparison: None.

CLINICAL DATA: Enlarged uterus, pain.

EXAM:
TRANSABDOMINAL ULTRASOUND OF PELVIS
TECHNIQUE: Transabdominal ultrasound examination of the pelvis was performed
including evaluation of the uterus, ovaries, adnexal regions, and
pelvic cul-de-sac.

[Series 1: us pelvis complete · 14 of 43 slices shown]
[im 1/43]
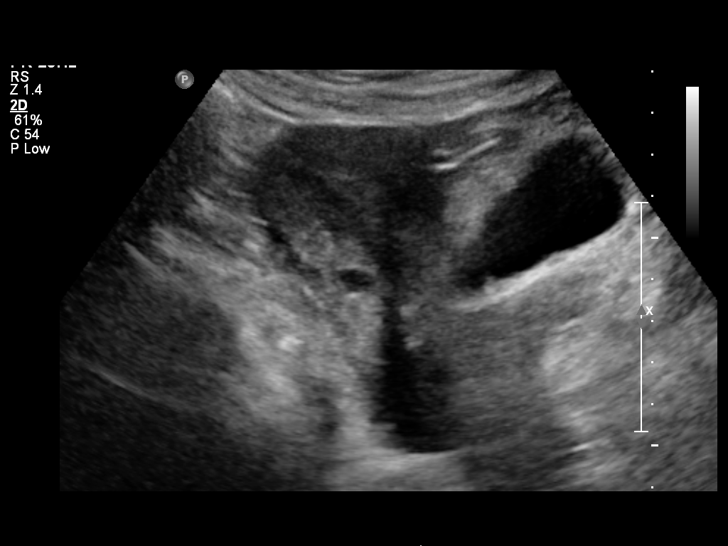
[im 4/43]
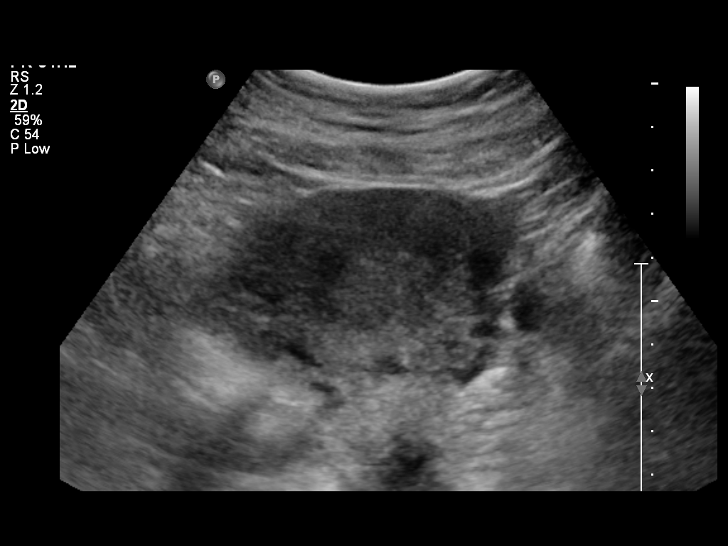
[im 8/43]
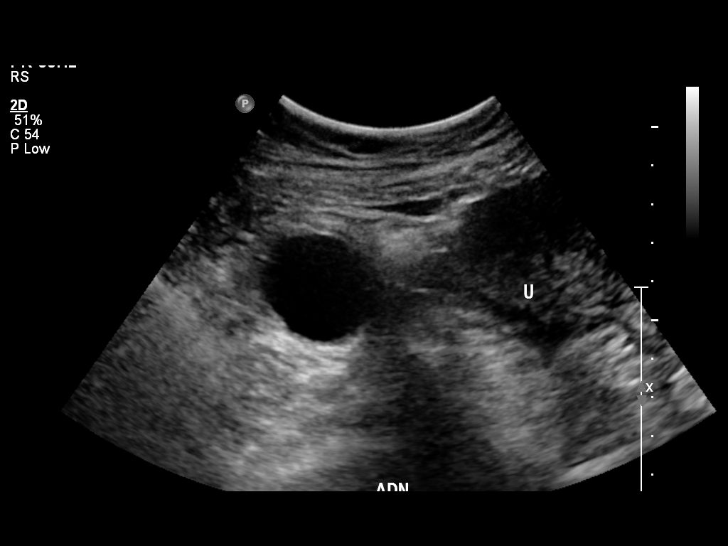
[im 11/43]
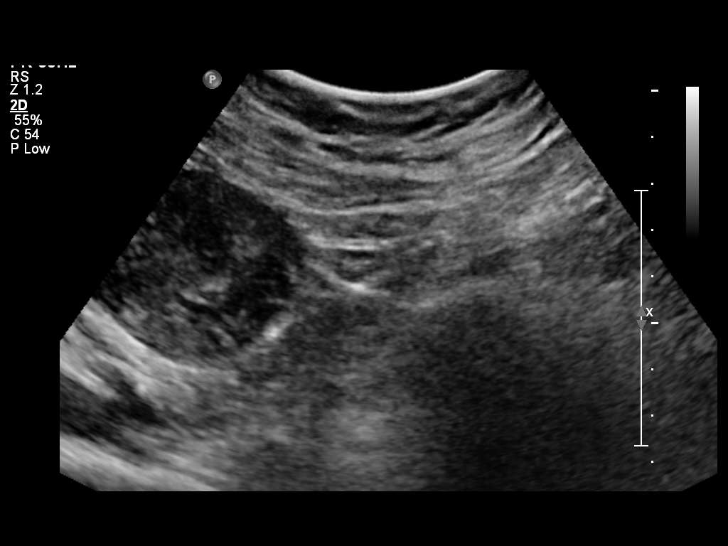
[im 15/43]
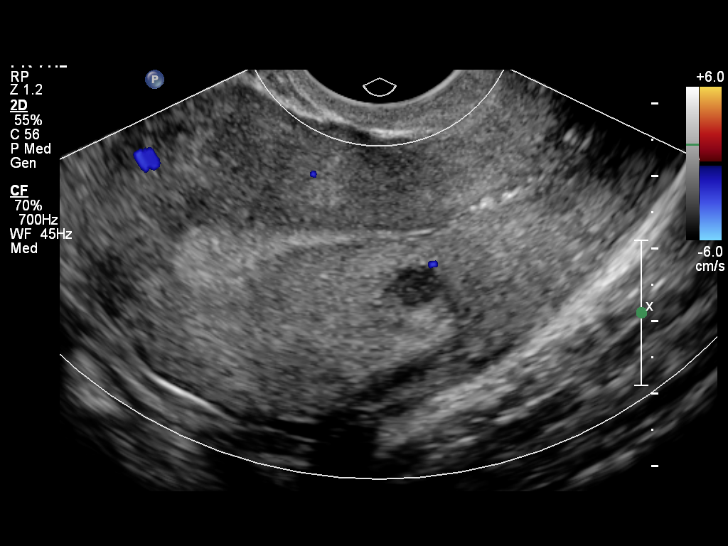
[im 16/43]
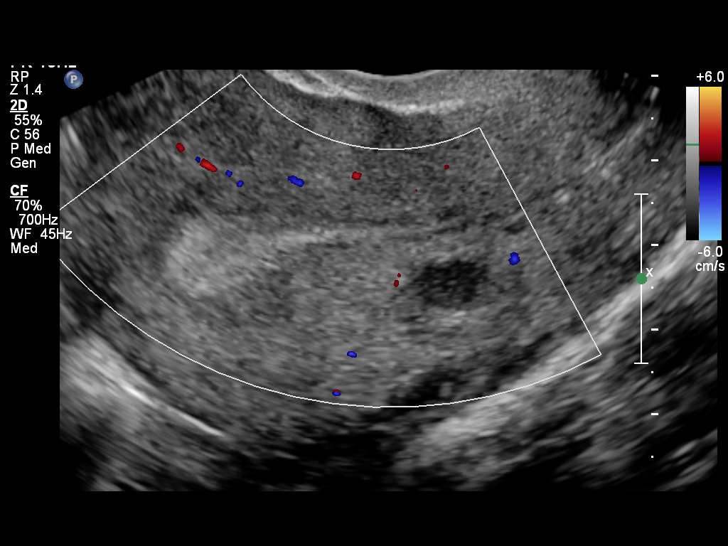
[im 20/43]
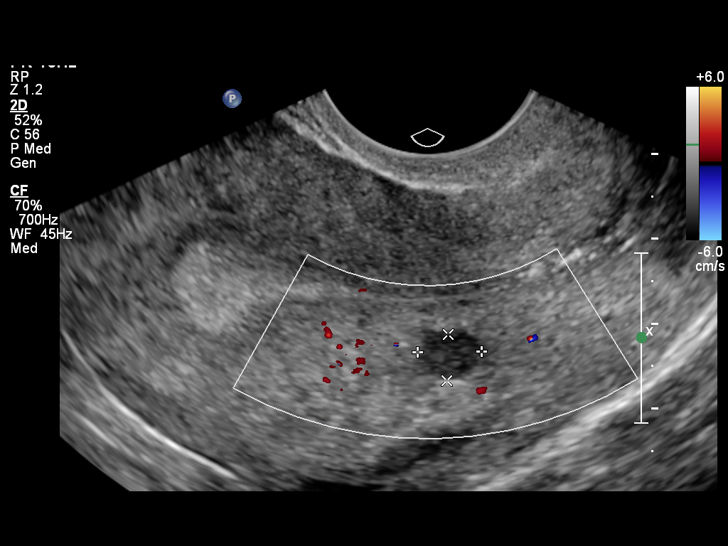
[im 23/43]
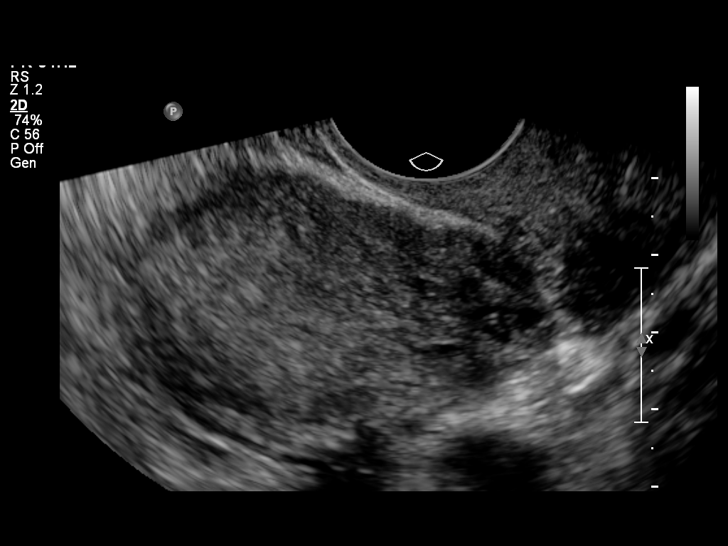
[im 27/43]
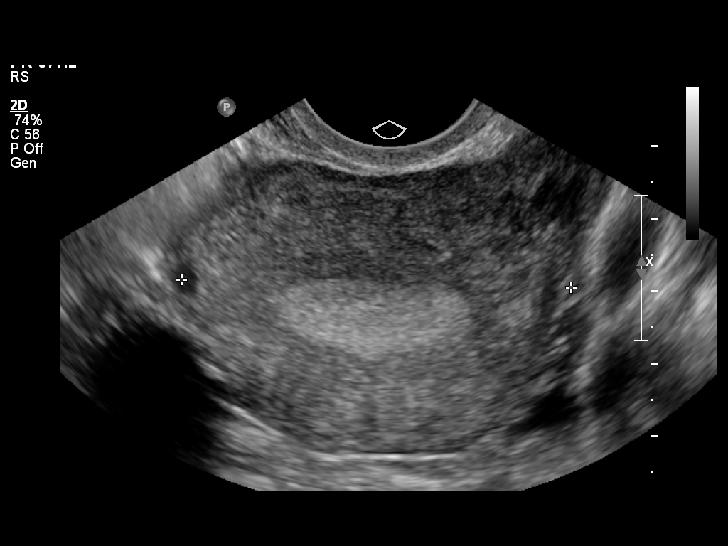
[im 29/43]
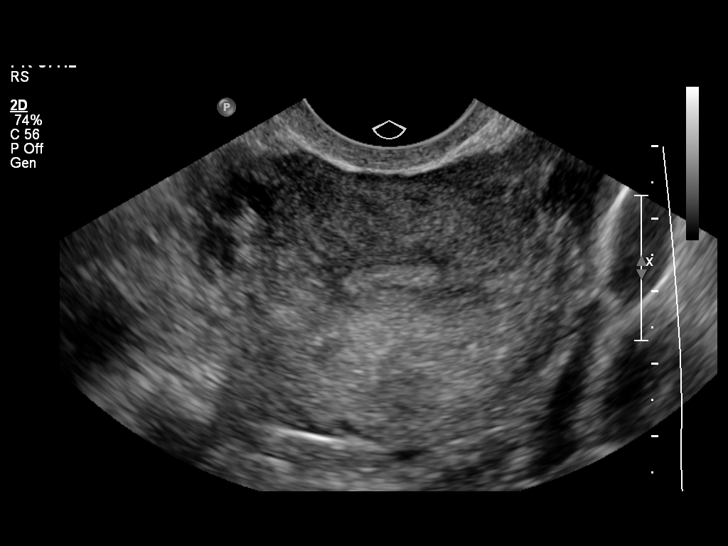
[im 32/43]
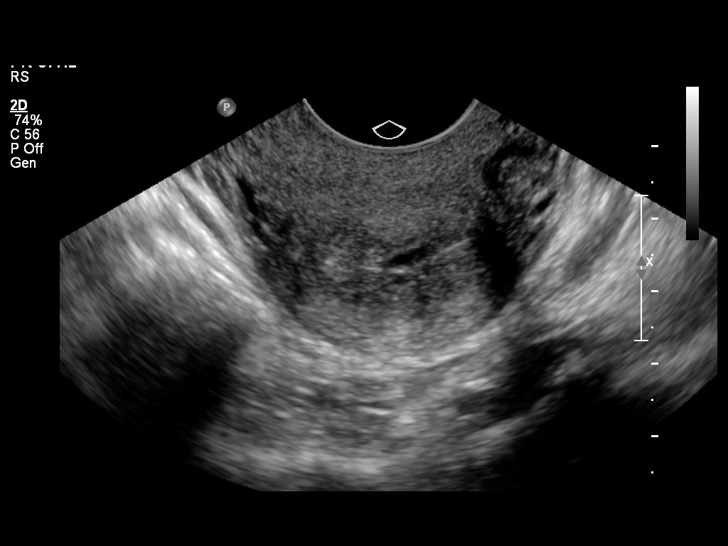
[im 36/43]
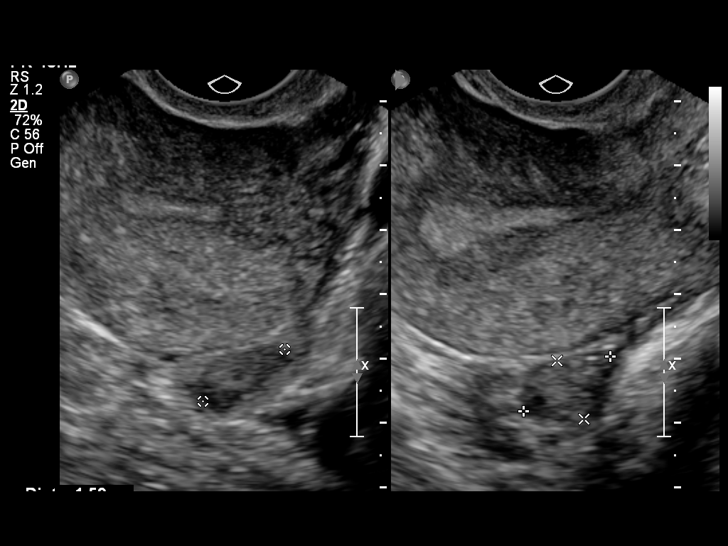
[im 39/43]
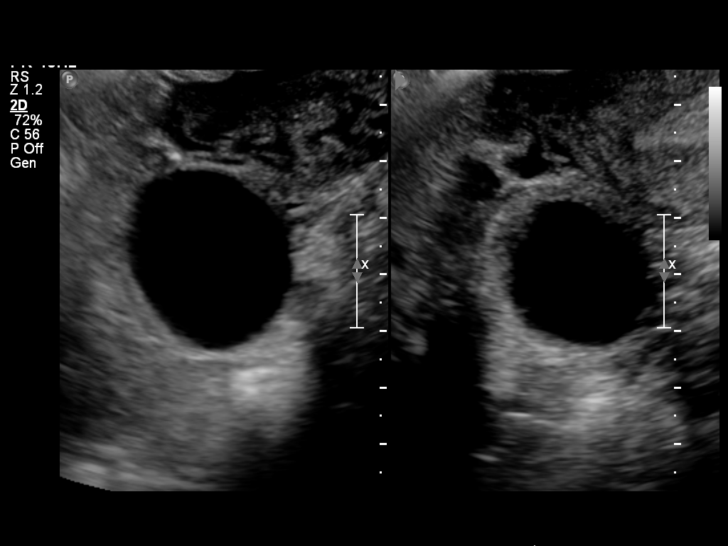
[im 43/43]
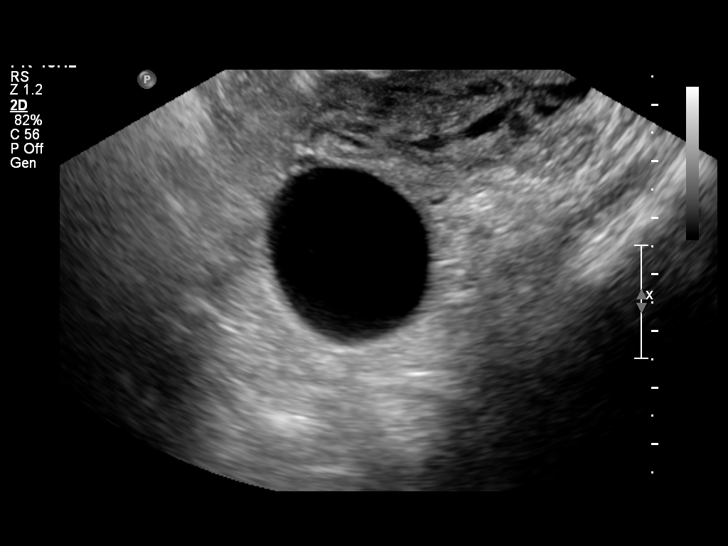

[14 of 25 positions shown; findings below may reference images not displayed]

FINDINGS: Uterus

Measurements: 8.2 x 9.4 x 5.4 cm. Tiny hypoechoic intramural
structure in the posterior lower uterine segment, likely small
fibroid measuring maximally 8 mm.

Endometrium

Thickness: Normal thickness, 10 mm. No focal abnormality visualized.

Right ovary

Measurements: 3.2 x 3.5 x 2.6 cm. 3.2 cm simple appearing cyst
within the right ovary.

Left ovary

Measurements: 1.6 x 1.0 x 1.5 cm. Normal appearance/no adnexal mass.

Other findings:  No free fluid
IMPRESSION: Tiny posterior intramural fibroid less than 1 cm in diameter.

3.2 cm simple appearing right ovarian cyst.

## 2015-03-04 ENCOUNTER — Ambulatory Visit: Payer: Self-pay | Admitting: Obstetrics & Gynecology

## 2015-03-04 DIAGNOSIS — Z01419 Encounter for gynecological examination (general) (routine) without abnormal findings: Secondary | ICD-10-CM
